# Patient Record
Sex: Male | Born: 1995 | Hispanic: Yes | Marital: Single | Smoking: Never smoker
Health system: Southern US, Community
[De-identification: ages and names within clinical notes are randomized; demographics above are authoritative.]

---
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
## 2016-10-20 ENCOUNTER — Ambulatory Visit (INDEPENDENT_AMBULATORY_CARE_PROVIDER_SITE_OTHER): Payer: BLUE CROSS/BLUE SHIELD | Admitting: Physician Assistant

## 2016-10-20 VITALS — BP 106/62 | HR 81 | Temp 98.5°F | Resp 20 | Ht 65.5 in | Wt 215.4 lb

## 2016-10-20 DIAGNOSIS — L0291 Cutaneous abscess, unspecified: Secondary | ICD-10-CM

## 2016-10-20 DIAGNOSIS — L0591 Pilonidal cyst without abscess: Secondary | ICD-10-CM

## 2016-10-20 MED ORDER — AMOXICILLIN-POT CLAVULANATE 500-125 MG PO TABS: 1.0000 | ORAL_TABLET | Freq: Three times a day (TID) | ORAL | 0 refills | Status: AC

## 2016-10-20 NOTE — Progress Notes (Deleted)
     IF you received an x-ray today, you will receive an invoice from [facility]. Please contact [facility] at [phone] with questions or concerns regarding your invoice.   IF you received labwork today, you will receive an invoice from [facility]. Please contact [facility] at [phone] with questions or concerns regarding your invoice.   Our billing staff will not be able to assist you with questions regarding bills from these companies.  You will be contacted with the lab results as soon as they are available. The fastest way to get your results is to activate your My Chart account. Instructions are located on the last page of this paperwork. If you have not heard from us regarding the results in 2 weeks, please contact this office.     

## 2016-10-20 NOTE — Progress Notes (Signed)
   Isaac Cardenas  MRN:Nadara Mode [id number] DOB: 12/08/1995  PCP: No primary care provider on file.  Subjective:  Pt is a 21 year old male who presents to clinic for cyst on tailbone. "got big" about 4 days ago - couldn't sit, walk or lay down. Last night it popped and started draining. Felt a little better, but is still painful.  +foul odor.  He has had one in the past, it "popped on it's own" and got better.  Denies fever, chills, increased redness, swelling, headache, chest pain.   Review of Systems  Constitutional: Negative for chills, diaphoresis, fatigue and fever.  Respiratory: Negative for cough, chest tightness, shortness of breath and wheezing.   Cardiovascular: Negative for chest pain and palpitations.  Gastrointestinal: Negative for diarrhea, nausea and vomiting.  Skin: Positive for wound.  Neurological: Negative for dizziness, syncope, light-headedness and headaches.  Psychiatric/Behavioral: Negative for sleep disturbance.    There are no active problems to display for this patient.   No current outpatient prescriptions on file prior to visit.   No current facility-administered medications on file prior to visit.     No Known Allergies   Objective:  BP 106/62   Pulse 81   Temp 98.5 F (36.9 C) (Oral)   Resp 20   Ht 5' 5.5" (1.664 m)   Wt 215 lb 6 oz (97.7 kg)   SpO2 98%   BMI 35.30 kg/m   Physical Exam  Constitutional: He is oriented to person, place, and time and well-developed, well-nourished, and in no distress. No distress.  Cardiovascular: Normal rate, regular rhythm and normal heart sounds.   Neurological: He is alert and oriented to person, place, and time. GCS score is 15.  Skin: Skin is warm and dry.     Psychiatric: Mood, memory, affect and judgment normal.  Vitals reviewed.  Procedure: Verbal consent obtained. Skin was cleaned with soap and water and anesthetized with 2% lidocaine. A 1 cm incision was made. A small amount of purulent material  expressed. Wound cavity size explored with hemostats and found to extend superiorly. Wound was packed with 1/4 inch packing. Wound dressed and wound care discussed.  Assessment and Plan :  1. Pilonidal cyst 2. Abscess - WOUND CULTURE - amoxicillin-clavulanate (AUGMENTIN) 500-125 MG tablet; Take 1 tablet (500 mg total) by mouth 3 (three) times daily.  Dispense: 15 tablet; Refill: 0 - Culture pending. Wound packed with 1/4 in packing. Wound care discussed. RTC in 48 hours for wound care.   Marco CollieWhitney Caliber Landess, PA-C  [facility] 10/20/2016 9:26 AM

## 2016-10-20 NOTE — Patient Instructions (Signed)
Change dressing if dressing is soiled. Keep covered at all times except when showering. You may get wet in the shower but do not scrub.  Apply warm compresses/heating pad to facilitate drainage. Leave packing in place. Do not apply any ointments as this may delay healing. If an antibiotic was prescribed, take as directed until finished. Return in 48 hours for wound care.  Thank you for coming in today. I hope you feel we met your needs.  Feel free to call [facility] if you have any questions or further requests.  Please consider signing up for MyChart if you do not already have it, as this is a great way to communicate with me.  Best,  Whitney McVey, PA-C  

## 2016-10-23 ENCOUNTER — Ambulatory Visit (INDEPENDENT_AMBULATORY_CARE_PROVIDER_SITE_OTHER): Payer: BLUE CROSS/BLUE SHIELD | Admitting: Family Medicine

## 2016-10-23 VITALS — BP 130/70 | HR 77 | Temp 98.5°F | Resp 17 | Ht 65.5 in | Wt 220.0 lb

## 2016-10-23 DIAGNOSIS — L0501 Pilonidal cyst with abscess: Secondary | ICD-10-CM

## 2016-10-23 NOTE — Progress Notes (Signed)
   Patient ID: Isaac Cardenas, male    DOB: 10/11/1995, 21 y.o.   MRN: [id number]  PCP: No primary care provider on file.  Chief Complaint  Patient presents with  . Wound Check    Subjective:  HPI  21 year old male presents for evaluation of wound check following a abscess drainage on 10/20/16. Pt reports he delayed starting antibiotics by a couple of days as he was unable to pick-up medication.  Denies pain or tenderness at site . Has changed dressing daily. Reports that the wound still has an odor.   Social History   Social History  . Marital status: Single    Spouse name: N/A  . Number of children: N/A  . Years of education: N/A   Occupational History  . Not on file.   Social History Main Topics  . Smoking status: Never Smoker  . Smokeless tobacco: Never Used  . Alcohol use No  . Drug use: No  . Sexual activity: Not on file   Other Topics Concern  . Not on file   Social History Narrative  . No narrative on file   Review of Systems See HPI   Prior to Admission medications   Medication Sig Start Date End Date Taking? Authorizing Provider  amoxicillin-clavulanate (AUGMENTIN) 500-125 MG tablet Take 1 tablet (500 mg total) by mouth 3 (three) times daily. 10/20/16 10/25/16 Yes Madelaine BhatElizabeth Whitney McVey, PA-C  Past Medical, Surgical Family and Social History reviewed and updated.    Objective:   Today's Vitals   10/23/16 1715  BP: 130/70  Pulse: 77  Resp: 17  Temp: 98.5 F (36.9 C)  TempSrc: Oral  SpO2: 99%  Weight: 220 lb (99.8 kg)  Height: 5' 5.5" (1.664 m)    Wt Readings from Last 3 Encounters:  10/23/16 220 lb (99.8 kg)  10/20/16 215 lb 6 oz (97.7 kg)    Physical Exam  Skin:  Malodorous drainage remains present at the site of wound after packing removed. No evidence of erythema expansion beyond the site of wound.     Assessment & Plan:  1. Pilonidal cyst with abscess  -Complete antibiotics  -Repacked and Redressed wound. -Return for follow-up  on Saturday with [place] for wound re-evaluation  Godfrey PickKimberly S. Tiburcio PeaHarris, MSN, FNP-C [facility] [phone]

## 2016-10-23 NOTE — Patient Instructions (Signed)
     IF you received an x-ray today, you will receive an invoice from [facility]. Please contact [facility] at [phone] with questions or concerns regarding your invoice.   IF you received labwork today, you will receive an invoice from [facility]. Please contact [facility] at [phone] with questions or concerns regarding your invoice.   Our billing staff will not be able to assist you with questions regarding bills from these companies.  You will be contacted with the lab results as soon as they are available. The fastest way to get your results is to activate your My Chart account. Instructions are located on the last page of this paperwork. If you have not heard from us regarding the results in 2 weeks, please contact this office.     

## 2016-10-26 LAB — WOUND CULTURE: Organism ID, Bacteria: NONE SEEN

## 2016-10-27 ENCOUNTER — Ambulatory Visit (INDEPENDENT_AMBULATORY_CARE_PROVIDER_SITE_OTHER): Payer: BLUE CROSS/BLUE SHIELD | Admitting: Physician Assistant

## 2016-10-27 VITALS — BP 110/68 | HR 75 | Temp 98.1°F | Resp 16 | Ht 65.5 in | Wt 215.0 lb

## 2016-10-27 DIAGNOSIS — L0501 Pilonidal cyst with abscess: Secondary | ICD-10-CM

## 2016-10-27 NOTE — Patient Instructions (Addendum)
Complete antibiotic.  Use warm compress to the affected area.  Keep clean and dry, change dressing if it becomes soaked. Follow up in 2-3 days for packing reevaluation.  Thank you for letting me participate in your health and well being.     IF you received an x-ray today, you will receive an [facility]. Please contact [facility] at [phone] with questions or concerns regarding your invoice.   IF you received labwork today, you will receive an invoice from [facility]. Please contact [facility] at [phone] with questions or concerns regarding your invoice.   Our billing staff will not be able to assist you with questions regarding bills from these companies.  You will be contacted with the lab results as soon as they are available. The fastest way to get your results is to activate your My Chart account. Instructions are located on the last page of this paperwork. If you have not heard from us regarding the results in 2 weeks, please contact this office.

## 2016-10-27 NOTE — Progress Notes (Signed)
    MRN: [id number] DOB: 02/16/1996  Subjective:   Isaac Cardenas is a 21 y.o. male presenting for follow up on wound care. Initial OV was 10/20/16 for I&D of abscess. Wound cavity was packed and pt placed on augmentin. He returned on 10/23/16 for repacking. Notes he is doing well today. He has been keeping it clean and dry. Denies purulent drainage, pain, fever, and chills.   Isaac Cardenas has a current medication list which includes the following prescription(s): amoxicillin-clavulanate. Also has No Known Allergies.  Isaac Cardenas  has no past medical history on file. Also  has no past surgical history on file.   Objective:   Vitals: BP 110/68   Pulse 75   Temp 98.1 F (36.7 C) (Oral)   Resp 16   Ht 5' 5.5" (1.664 m)   Wt 215 lb (97.5 kg)   SpO2 99%   BMI 35.23 kg/m   Physical Exam  Constitutional: He is oriented to person, place, and time. He appears well-developed and well-nourished.  HENT:  Head: Normocephalic and atraumatic.  Eyes: Conjunctivae are normal.  Neck: Normal range of motion.  Pulmonary/Chest: Effort normal.  Neurological: He is alert and oriented to person, place, and time.  Skin: Skin is warm and dry.     Psychiatric: He has a normal mood and affect.  Vitals reviewed.  Procedure: Packing removed. Wound cavity cleansed with 10cc 2% lidocaine without epinephrine. Depth of wound cavity measures about 1.5cm. Wound cavity lightly repacked with 0.25" packing. Dressing applied.   No results found for this or any previous visit (from the past 24 hour(s)).  Assessment and Plan :  1. Pilonidal cyst with abscess Repacked, instructed to complete antibiotic course. Wound care instructions given. Start using warm compresses daily. Follow up in 2-3 days for packing reevaluation.   Benjiman CoreBrittany Stephani Janak, PA-C  [facility] 10/27/2016 1:13 PM

## 2016-10-29 ENCOUNTER — Ambulatory Visit (INDEPENDENT_AMBULATORY_CARE_PROVIDER_SITE_OTHER): Payer: BLUE CROSS/BLUE SHIELD | Admitting: Physician Assistant

## 2016-10-29 VITALS — HR 80 | Temp 98.3°F | Resp 16

## 2016-10-29 DIAGNOSIS — Z5189 Encounter for other specified aftercare: Secondary | ICD-10-CM

## 2016-10-29 DIAGNOSIS — L0591 Pilonidal cyst without abscess: Secondary | ICD-10-CM

## 2016-10-29 NOTE — Progress Notes (Signed)
[facility] Care at [facility]   MRN: [id number] DOB: 02/20/1996  Subjective:   Isaac Cardenas is a 21 y.o. male presenting for follow up on wound care of pilonidal cyst. Original OV 2/3, received I&D with packing and started on Augmentin. He has RTC every few days since that time for repacking. He is finished with his antibiotics and reports relief of pain and pressure. Feeling well today. Keeping wound clean and dry. No drainage or bleeding, fever or chills.   Lafayette DragonFelipe has a current medication list which includes the following prescription(s): amoxicillin-clavulanate. Also has No Known Allergies.  Lafayette DragonFelipe  has no past medical history on file. Also  has no past surgical history on file.   Objective:   Vitals: Pulse 80   Temp 98.3 F (36.8 C)   Resp 16   SpO2 99%   Physical Exam  Constitutional: He is oriented to person, place, and time. He appears well-developed and well-nourished.  Cardiovascular: Normal rate and regular rhythm.   Pulmonary/Chest: Effort normal. No respiratory distress.  Neurological: He is alert and oriented to person, place, and time.  Skin: Skin is warm and dry.     Psychiatric: He has a normal mood and affect. His behavior is normal. Judgment and thought content normal.  Vitals reviewed.   No results found for this or any previous visit (from the past 24 hour(s)).  Procedure: Verbal consent obtained. Wound lightly packed with 1/4 inch packing. Wound dressed and wound care discussed.  Assessment and Plan :  1. Encounter for wound care 2. Pilonidal cyst - Wound is healing well. Lightly repacked with 1/4 inch packing and dressed. RTC in 48 hours for wound care.   Marco CollieWhitney Joeangel Jeanpaul, PA-C  [facility] at [facility] 10/29/2016 6:03 PM

## 2016-10-29 NOTE — Patient Instructions (Signed)
     IF you received an x-ray today, you will receive an invoice from [facility]. Please contact [facility] at [phone] with questions or concerns regarding your invoice.   IF you received labwork today, you will receive an invoice from [facility]. Please contact [facility] at [phone] with questions or concerns regarding your invoice.   Our billing staff will not be able to assist you with questions regarding bills from these companies.  You will be contacted with the lab results as soon as they are available. The fastest way to get your results is to activate your My Chart account. Instructions are located on the last page of this paperwork. If you have not heard from us regarding the results in 2 weeks, please contact this office.     

## 2016-11-01 ENCOUNTER — Encounter: Payer: Self-pay | Admitting: Physician Assistant

## 2016-11-01 ENCOUNTER — Ambulatory Visit (INDEPENDENT_AMBULATORY_CARE_PROVIDER_SITE_OTHER): Payer: BLUE CROSS/BLUE SHIELD | Admitting: Physician Assistant

## 2016-11-01 VITALS — BP 130/68 | HR 79 | Temp 98.6°F | Resp 18 | Ht 65.5 in | Wt 227.0 lb

## 2016-11-01 DIAGNOSIS — Z5189 Encounter for other specified aftercare: Secondary | ICD-10-CM

## 2016-11-01 NOTE — Progress Notes (Signed)
    MRN: [id number] DOB: 11/05/1995  Subjective:   Isaac Cardenas is a 21 y.o. male presenting for follow up on wound care of pilonidal cyst. Original OV 2/3, received I&D with packing and started on Augmentin. He has RTC every few days since that time for repacking. He has completed antibiotics. Reports that his pain and pressure have completely resolved. Feeling well today. Keeping wound clean and dry. No drainage, bleeding, fever or chills.   Isaac Cardenas currently has no medications in their medication list. Also has No Known Allergies.  Isaac Cardenas  has no past medical history on file. Also  has no past surgical history on file.   Objective:   Vitals: BP 130/68 (BP Location: Right Arm, Patient Position: Sitting, Cuff Size: Large)   Pulse 79   Temp 98.6 F (37 C) (Oral)   Resp 18   Ht 5' 5.5" (1.664 m)   Wt 227 lb (103 kg)   SpO2 98%   BMI 37.20 kg/m   Physical Exam  Constitutional: He is oriented to person, place, and time. He appears well-developed and well-nourished.  HENT:  Head: Normocephalic and atraumatic.  Eyes: Conjunctivae are normal.  Neck: Normal range of motion.  Pulmonary/Chest: Effort normal.  Neurological: He is alert and oriented to person, place, and time.  Skin: Skin is warm and dry.     Psychiatric: He has a normal mood and affect.  Vitals reviewed.  Procedure: Packing removed. No purulent drainage expressed. Wound cavity cleansed with lidocaine 1% without epinephrine. Bandaid placed.   No results found for this or any previous visit (from the past 24 hour(s)).  Assessment and Plan :  1. Encounter for wound care Well healing wound. Further packing not warranted. Pt instructed to return to clinic as needed.    Benjiman CoreBrittany Delmar Dondero, PA-C  [facility] 11/01/2016 5:57 PM

## 2016-11-01 NOTE — Patient Instructions (Addendum)
Thank you for letting me participate in your health and well being.     IF you received an x-ray today, you will receive an invoice from [facility]. Please contact [facility] at [phone] with questions or concerns regarding your invoice.   IF you received labwork today, you will receive an invoice from [facility]. Please contact [facility] at [phone] with questions or concerns regarding your invoice.   Our billing staff will not be able to assist you with questions regarding bills from these companies.  You will be contacted with the lab results as soon as they are available. The fastest way to get your results is to activate your My Chart account. Instructions are located on the last page of this paperwork. If you have not heard from us regarding the results in 2 weeks, please contact this office.      

## 2018-01-17 ENCOUNTER — Encounter: Payer: Self-pay | Admitting: Family Medicine

## 2018-01-17 ENCOUNTER — Other Ambulatory Visit: Payer: Self-pay

## 2018-01-17 ENCOUNTER — Ambulatory Visit (INDEPENDENT_AMBULATORY_CARE_PROVIDER_SITE_OTHER): Payer: Managed Care, Other (non HMO) | Admitting: Family Medicine

## 2018-01-17 VITALS — BP 120/84 | HR 71 | Temp 97.6°F | Ht 66.5 in | Wt 201.0 lb

## 2018-01-17 DIAGNOSIS — Z131 Encounter for screening for diabetes mellitus: Secondary | ICD-10-CM

## 2018-01-17 DIAGNOSIS — Z113 Encounter for screening for infections with a predominantly sexual mode of transmission: Secondary | ICD-10-CM | POA: Diagnosis not present

## 2018-01-17 DIAGNOSIS — N4889 Other specified disorders of penis: Secondary | ICD-10-CM

## 2018-01-17 DIAGNOSIS — N481 Balanitis: Secondary | ICD-10-CM | POA: Diagnosis not present

## 2018-01-17 MED ORDER — CLOTRIMAZOLE 1 % EX CREA: 1.0000 "application " | TOPICAL_CREAM | Freq: Two times a day (BID) | CUTANEOUS | 1 refills | Status: AC

## 2018-01-17 NOTE — Patient Instructions (Addendum)
   IF you received an x-ray today, you will receive an invoice from [facility]. Please contact [facility] at [phone] with questions or concerns regarding your invoice.   IF you received labwork today, you will receive an invoice from [facility]. Please contact [facility] at [phone] with questions or concerns regarding your invoice.   Our billing staff will not be able to assist you with questions regarding bills from these companies.  You will be contacted with the lab results as soon as they are available. The fastest way to get your results is to activate your My Chart account. Instructions are located on the last page of this paperwork. If you have not heard from us regarding the results in 2 weeks, please contact this office.      Balanitis Balanitis is swelling and irritation (inflammation) of the head of the penis (glans penis). The condition may also cause inflammation of the skin around the glans penis (foreskin) in men who have not been circumcised. It may develop because of an infection or another medical condition. Balanitis occurs most often among men who have not had their foreskin removed (uncircumcised men). Balanitis sometimes causes scarring of the penis or foreskin, which can require surgery. Untreated balanitis can increase the risk of penile cancer. What are the causes? Common causes of this condition include:  Poor personal hygiene, especially in uncircumcised men. Not cleaning the glans penis and foreskin well can result in buildup of bacteria, viruses, and yeast, which can lead to infection and inflammation.  Irritation and lack of air flow due to fluid (smegma) that can build up on the glans penis.  Other causes include:  Chemical irritation from products such as soaps or shower gels (especially those that have fragrance), condoms, personal lubricants, petroleum jelly, spermicides, or fabric softeners.  Skin conditions, such as eczema,  dermatitis, and psoriasis.  Allergies to medicines, such as tetracycline and sulfa drugs.  Certain medical conditions, including liver cirrhosis, congestive heart failure, diabetes, and kidney disease.  Infections, such as candidiasis, HPV (human papillomavirus), herpes simplex, gonorrhea, and syphilis.  Severe obesity.  What increases the risk? The following factors may make you more likely to develop this condition:  Having diabetes. This is the most common risk factor.  Having a tight foreskin that is difficult to pull back (retract) past the glans.  Having sexual intercourse without using a condom.  What are the signs or symptoms? Symptoms of this condition include:  Discharge from under the foreskin.  A bad smell.  Pain or difficulty retracting the foreskin.  Tenderness, redness, and swelling of the glans.  A rash or sores on the glans or foreskin.  Itchiness.  Inability to get an erection due to pain.  Difficulty urinating.  Scarring of the penis or foreskin, in some cases.  How is this diagnosed? This condition may be diagnosed based on:  A physical exam.  Testing a swab of discharge to check for bacterial or fungal infection.  Blood tests: ? To check for viruses that can cause balanitis. ? To check your blood sugar (glucose) level. High blood glucose could be a sign of diabetes, which can cause balanitis.  How is this treated? Treatment for balanitis depends on the cause. Treatment may include:  Improving personal hygiene. Your health care provider may recommend sitting in a bath of warm water that is deep enough to cover your hips and buttocks (sitz bath).  Medicines such as: ? Creams or ointments to reduce swelling (steroids) or   to treat an infection. ? Antibiotic medicine. ? Antifungal medicine.  Surgery to remove or cut the foreskin (circumcision). This may be done if you have scarring on the foreskin that makes it difficult to  retract.  Controlling other medical problems that may be causing your condition or making it worse.  Follow these instructions at home:  Do not have sex until the condition clears up, or until your health care provider approves.  Keep your penis clean and dry. Take sitz baths as recommended by your health care provider.  Avoid products that irritate your skin or make symptoms worse, such as soaps and shower gels that have fragrance.  Take over-the-counter and prescription medicines only as told by your health care provider. ? If you were prescribed an antibiotic medicine or a cream or ointment, use it as told by your health care provider. Do not stop using your medicine, cream, or ointment even if you start to feel better. ? Do not drive or use heavy machinery while taking prescription pain medicine. Contact a health care provider if:  Your symptoms get worse or do not improve with home care.  You develop chills or a fever.  You have trouble urinating.  You cannot retract your foreskin. Get help right away if:  You develop severe pain.  You are unable to urinate. Summary  Balanitis is inflammation of the head of the penis (glans penis) caused by irritation or infection.  Balanitis causes pain, redness, and swelling of the glans penis.  This condition is most common among uncircumcised men who do not keep their glans penis clean and in men who have diabetes.  Treatment may include creams or ointments.  Good hygiene is important for prevention. This includes pulling back the foreskin when washing your penis. This information is not intended to replace advice given to you by your health care provider. Make sure you discuss any questions you have with your health care provider. Document Released: 01/20/2009 Document Revised: 07/23/2016 Document Reviewed: 07/23/2016 Elsevier Interactive Patient Education  2017 [employer]  

## 2018-01-19 LAB — HCV INTERPRETATION

## 2018-01-19 LAB — HEMOGLOBIN A1C
Est. average glucose Bld gHb Est-mCnc: <74 mg/dL
Hgb A1c MFr Bld: <4.2 % — ABNORMAL LOW (ref 4.8–5.6)

## 2018-01-19 LAB — GC/CHLAMYDIA PROBE AMP
Chlamydia trachomatis, NAA: NEGATIVE
Neisseria gonorrhoeae by PCR: NEGATIVE

## 2018-01-19 LAB — TRICHOMONAS VAGINALIS, PROBE AMP: Trich vag by NAA: NEGATIVE

## 2018-01-19 LAB — HCV AB W REFLEX TO QUANT PCR: HCV Ab: <0.1 s/co ratio (ref 0.0–0.9)

## 2018-01-19 LAB — RPR: RPR Ser Ql: NONREACTIVE

## 2018-01-19 LAB — HIV ANTIBODY (ROUTINE TESTING W REFLEX): HIV Screen 4th Generation wRfx: NONREACTIVE

## 2018-01-23 ENCOUNTER — Encounter: Payer: Self-pay | Admitting: Radiology

## 2018-01-27 ENCOUNTER — Encounter: Payer: Self-pay | Admitting: Family Medicine

## 2018-01-27 NOTE — Progress Notes (Signed)
   5/13/201910:27 AM  Isaac Cardenas 1996/06/28, 22 y.o. male [id number]  Chief Complaint  Patient presents with  . Exposure to STD    spot on genitals    HPI:   Patient is a 22 y.o. male who presents today for concerns of STD Patient reports that for the past week he has noticed redness and chunky moist white discharge around glans He is uncircumcised, he is able to fully retract foreskin He denies any itching, polydipsia or polyuria He denies any penile discharge or lesions He denies any h/o STD He has only one sex partner, male, have been together for a long time He denies any unintentional weight loss, nigh sweats, swollen glands  Fall Risk  01/17/2018 11/01/2016 10/27/2016 10/20/2016  Falls in the past year? No No No No     Depression screen [facility] 2/9 01/17/2018 11/01/2016 10/27/2016  Decreased Interest 0 0 0  Down, Depressed, Hopeless 0 0 0  PHQ - 2 Score 0 0 0    No Known Allergies  Prior to Admission medications   Medication Sig Start Date End Date Taking? Authorizing Provider    History reviewed. No pertinent past medical history.  History reviewed. No pertinent surgical history.  Social History   Tobacco Use  . Smoking status: Never Smoker  . Smokeless tobacco: Never Used  Substance Use Topics  . Alcohol use: No    History reviewed. No pertinent family history.  ROS Per hpi  OBJECTIVE:  Blood pressure 120/84, pulse 71, temperature 97.6 F (36.4 C), temperature source Oral, height 5' 6.5" (1.689 m), weight 201 lb (91.2 kg), SpO2 98 %.  Physical Exam  Constitutional: He is oriented to person, place, and time.  HENT:  Head: Normocephalic and atraumatic.  Mouth/Throat: Oropharynx is clear and moist.  Eyes: Pupils are equal, round, and reactive to light. EOM are normal.  Neck: Neck supple.  Pulmonary/Chest: Effort normal.  Genitourinary: Testes normal. Uncircumcised. Penile erythema (moist) present. Discharge (not urethral, chunky white discharge)  found.  Neurological: He is alert and oriented to person, place, and time.  Skin: Skin is warm and dry.  Nursing note and vitals reviewed.    ASSESSMENT and PLAN  1. Balanitis Discussed diagnosis, reassured not STD. Discussed safe sex practices. Discussed importance of proper hygiene and keeping area dry. Clotrimazole rx. RTC precautions given. Screening for DM. - Hemoglobin A1c  2. Screen for STD (sexually transmitted disease) - HCV Ab w Reflex to Quant PCR - HIV antibody - RPR - Trichomonas vaginalis, RNA - GC/Chlamydia Probe Amp([facility])  3. Screening for diabetes mellitus - Hemoglobin A1c  Other orders - clotrimazole (LOTRIMIN) 1 % cream; Apply 1 application topically 2 (two) times daily. - Interpretation:  Return if symptoms worsen or fail to improve.    Myles Lipps, MD [facility] at [facility] [address] [place], [place] [address] Ph.  [phone] Fax [phone]

## 2019-05-05 ENCOUNTER — Other Ambulatory Visit: Payer: Self-pay

## 2019-05-05 ENCOUNTER — Ambulatory Visit: Payer: Managed Care, Other (non HMO) | Admitting: Emergency Medicine

## 2019-11-20 ENCOUNTER — Ambulatory Visit: Payer: Self-pay | Attending: Internal Medicine

## 2019-11-20 DIAGNOSIS — Z23 Encounter for immunization: Secondary | ICD-10-CM | POA: Insufficient documentation

## 2019-11-20 NOTE — Progress Notes (Signed)
   Covid-19 Vaccination Clinic  Name:  Isaac Cardenas    MRN: [id number] DOB: [birth year]  11/20/2019  [name] was observed post Covid-19 immunization for 15 minutes without incident. He was provided with Vaccine Information Sheet and instruction to access the V-Safe system.   [name] was instructed to call 911 with any severe reactions post vaccine: Marland Kitchen Difficulty breathing  . Swelling of face and throat  . A fast heartbeat  . A bad rash all over body  . Dizziness and weakness   Immunizations Administered    Name Date Dose VIS Date Route   Pfizer COVID-19 Vaccine 11/20/2019  6:46 PM 0.3 mL 08/28/2019 Intramuscular   Manufacturer: [employer], [employer]   Lot: [id number]   NDC: [address]-0

## 2019-12-22 ENCOUNTER — Ambulatory Visit: Payer: Self-pay | Attending: Internal Medicine

## 2019-12-22 DIAGNOSIS — Z23 Encounter for immunization: Secondary | ICD-10-CM

## 2019-12-22 NOTE — Progress Notes (Signed)
   Covid-19 Vaccination Clinic  Name:  Isaac Cardenas    MRN: [id number] DOB: 05-Oct-1995  12/22/2019  Mr. Isaac Cardenas was observed post Covid-19 immunization for 15 minutes without incident. He was provided with Vaccine Information Sheet and instruction to access the V-Safe system.   Mr. Isaac Cardenas was instructed to call 911 with any severe reactions post vaccine: Marland Kitchen Difficulty breathing  . Swelling of face and throat  . A fast heartbeat  . A bad rash all over body  . Dizziness and weakness   Immunizations Administered    Name Date Dose VIS Date Route   Pfizer COVID-19 Vaccine 12/22/2019  8:50 AM 0.3 mL 08/28/2019 Intramuscular   Manufacturer: [employer], [employer]   Lot: [id number]   NDC: [address]-4
